# Patient Record
Sex: Female | Born: 1950 | Race: White | Hispanic: No | Marital: Married
Health system: Southern US, Community
[De-identification: ages and names within clinical notes are randomized; demographics above are authoritative.]

---

## 2009-05-02 ENCOUNTER — Inpatient Hospital Stay: Payer: Self-pay | Admitting: Student

## 2011-01-11 ENCOUNTER — Emergency Department: Payer: Self-pay | Admitting: Emergency Medicine

## 2011-01-14 ENCOUNTER — Emergency Department: Payer: Self-pay | Admitting: Emergency Medicine

## 2011-04-30 ENCOUNTER — Inpatient Hospital Stay: Payer: Self-pay | Admitting: Internal Medicine

## 2014-03-30 ENCOUNTER — Emergency Department: Payer: Self-pay | Admitting: Emergency Medicine

## 2014-03-30 LAB — URINALYSIS, COMPLETE
Bacteria: NONE SEEN
Bilirubin,UR: NEGATIVE
Blood: NEGATIVE
Glucose,UR: NEGATIVE mg/dL (ref 0–75)
Ketone: NEGATIVE
Nitrite: NEGATIVE
Ph: 7 (ref 4.5–8.0)
Protein: NEGATIVE
RBC,UR: 3 /HPF (ref 0–5)
Specific Gravity: 1.016 (ref 1.003–1.030)
Squamous Epithelial: 6
WBC UR: 31 /HPF (ref 0–5)

## 2014-03-30 LAB — BASIC METABOLIC PANEL WITH GFR
Anion Gap: 9 (ref 7–16)
BUN: 5 mg/dL — ABNORMAL LOW (ref 7–18)
Calcium, Total: 7.7 mg/dL — ABNORMAL LOW (ref 8.5–10.1)
Chloride: 104 mmol/L (ref 98–107)
Co2: 28 mmol/L (ref 21–32)
Creatinine: 0.59 mg/dL — ABNORMAL LOW (ref 0.60–1.30)
EGFR (African American): >60
EGFR (Non-African Amer.): >60
Glucose: 67 mg/dL (ref 65–99)
Osmolality: 277 (ref 275–301)
Potassium: 3.7 mmol/L (ref 3.5–5.1)
Sodium: 141 mmol/L (ref 136–145)

## 2014-03-30 LAB — TROPONIN I: Troponin-I: <0.02 ng/mL

## 2014-03-30 LAB — DRUG SCREEN, URINE
Amphetamines, Ur Screen: NEGATIVE (ref ?–1000)
Barbiturates, Ur Screen: NEGATIVE (ref ?–200)
Benzodiazepine, Ur Scrn: POSITIVE (ref ?–200)
CANNABINOID 50 NG, UR ~~LOC~~: NEGATIVE (ref ?–50)
Cocaine Metabolite,Ur ~~LOC~~: NEGATIVE (ref ?–300)
MDMA (Ecstasy)Ur Screen: NEGATIVE (ref ?–500)
METHADONE, UR SCREEN: NEGATIVE (ref ?–300)
Opiate, Ur Screen: POSITIVE (ref ?–300)
PHENCYCLIDINE (PCP) UR S: NEGATIVE (ref ?–25)
Tricyclic, Ur Screen: NEGATIVE (ref ?–1000)

## 2014-03-30 LAB — ETHANOL
Ethanol %: 0.083 % — ABNORMAL HIGH (ref 0.000–0.080)
Ethanol: 83 mg/dL

## 2014-03-30 LAB — CBC
HCT: 33.9 % — ABNORMAL LOW (ref 35.0–47.0)
HGB: 11.6 g/dL — ABNORMAL LOW (ref 12.0–16.0)
MCH: 37.4 pg — ABNORMAL HIGH (ref 26.0–34.0)
MCHC: 34.2 g/dL (ref 32.0–36.0)
MCV: 109 fL — ABNORMAL HIGH (ref 80–100)
PLATELETS: 438 10*3/uL (ref 150–440)
RBC: 3.1 10*6/uL — ABNORMAL LOW (ref 3.80–5.20)
RDW: 14.2 % (ref 11.5–14.5)
WBC: 6.4 10*3/uL (ref 3.6–11.0)

## 2014-05-20 ENCOUNTER — Ambulatory Visit: Payer: Self-pay | Admitting: Hospice and Palliative Medicine

## 2014-06-07 LAB — CBC WITH DIFFERENTIAL/PLATELET
BASOS ABS: 0 10*3/uL (ref 0.0–0.1)
Basophil %: 0.6 %
EOS PCT: 1.9 %
Eosinophil #: 0.1 10*3/uL (ref 0.0–0.7)
HCT: 30.1 % — ABNORMAL LOW (ref 35.0–47.0)
HGB: 10.3 g/dL — AB (ref 12.0–16.0)
LYMPHS ABS: 2.5 10*3/uL (ref 1.0–3.6)
LYMPHS PCT: 37.7 %
MCH: 36.3 pg — AB (ref 26.0–34.0)
MCHC: 34.1 g/dL (ref 32.0–36.0)
MCV: 106 fL — ABNORMAL HIGH (ref 80–100)
Monocyte #: 0.7 x10 3/mm (ref 0.2–0.9)
Monocyte %: 10.5 %
Neutrophil #: 3.2 10*3/uL (ref 1.4–6.5)
Neutrophil %: 49.3 %
Platelet: 343 10*3/uL (ref 150–440)
RBC: 2.83 10*6/uL — ABNORMAL LOW (ref 3.80–5.20)
RDW: 14.2 % (ref 11.5–14.5)
WBC: 6.5 10*3/uL (ref 3.6–11.0)

## 2014-06-07 LAB — URINALYSIS, COMPLETE
Bilirubin,UR: NEGATIVE
Blood: NEGATIVE
Glucose,UR: NEGATIVE mg/dL (ref 0–75)
Hyaline Cast: 3
Ketone: NEGATIVE
NITRITE: NEGATIVE
PH: 6 (ref 4.5–8.0)
Protein: NEGATIVE
Specific Gravity: 1.013 (ref 1.003–1.030)
Squamous Epithelial: 1
Transitional Epi: 1
WBC UR: 18 /HPF (ref 0–5)

## 2014-06-07 LAB — BASIC METABOLIC PANEL
Anion Gap: 8 (ref 7–16)
BUN: 6 mg/dL — AB (ref 7–18)
Calcium, Total: 8 mg/dL — ABNORMAL LOW (ref 8.5–10.1)
Chloride: 108 mmol/L — ABNORMAL HIGH (ref 98–107)
Co2: 26 mmol/L (ref 21–32)
Creatinine: 0.78 mg/dL (ref 0.60–1.30)
EGFR (Non-African Amer.): >60
Glucose: 78 mg/dL (ref 65–99)
Osmolality: 280 (ref 275–301)
Potassium: 4.5 mmol/L (ref 3.5–5.1)
SODIUM: 142 mmol/L (ref 136–145)

## 2014-06-09 ENCOUNTER — Inpatient Hospital Stay: Payer: Self-pay | Admitting: Internal Medicine

## 2014-06-09 LAB — TSH: Thyroid Stimulating Horm: 4.2 u[IU]/mL

## 2014-06-19 ENCOUNTER — Ambulatory Visit: Payer: Self-pay | Admitting: Hospice and Palliative Medicine

## 2015-01-10 NOTE — Discharge Summary (Signed)
Dates of Admission and Diagnosis:  Date of Admission 09-Jun-2014   Date of Discharge 12-Jun-2014   Admitting Diagnosis Weakness, weight loss   Final Diagnosis lung nodule- weight loss, loss of appetite- may be malignancy COPD UTI    Chief Complaint/History of Present Illness a 64 year old Caucasian female who presents to the Emergency Department with severe pain on her left side. The patient states that she fell 3 days ago. She landed on her left hip and somehow struck the medial side of her left knee as well. She was able to scoot on the floor over to her couch, but was unable to lift herself due to weakness. She called multiple friends and subsequently the EMS staff, who helped her into her chair. She has been sleeping on the couch since that time and has been so weak she has been unable to reach the restroom. This caused her to defecate in a trash can in her living room. She states that she is mostly in pain in her left hip and groin, but feels as if she has ???knives in her knee". She thinks that she fell because she got dizzy, although she admits that her upper body strength is weak. She ambulates with a walker.   Also, the patient states that she was diagnosed with a urinary tract infection at her last doctor's appointment earlier this week. She had been taking oral antibiotics, but was instructed by her primary care physician  that they would send another antibiotic based on sensitivities of her urine culture. For these reasons, the Emergency Department staff called for admission.   Allergies:  Penicillin: Other  General Ref:  19-Sep-15 05:18   Vitamin D 1,25 - Dihydroxy - 10 ========== TEST NAME ==========  ========= RESULTS =========  = REFERENCE RANGE =  CALCITRIOL  Calcitriol(1,25 di-OH Vit D) Calcitriol(1,25 di-OH Vit D)    [   31.3 pg/mL           ]         19.9-79.3               [facility]  No: [id number]           [address], [place], [place] [address]            Lindon Romp, MD         [phone]   Result(s) reported on 09 Jun 2014 at 05:30PM.  Routine Chem:  19-Sep-15 05:18   Glucose, Serum 78  BUN  6  Creatinine (comp) 0.78  Sodium, Serum 142  Potassium, Serum 4.5  Chloride, Serum  108  CO2, Serum 26  Calcium (Total), Serum  8.0  Anion Gap 8  Osmolality (calc) 280  eGFR (African American) >60  eGFR (Non-African American) >60 (eGFR values <6m/min/1.73 m2 may be an indication of chronic kidney disease (CKD). Calculated eGFR is useful in patients with stable renal function. The eGFR calculation will not be reliable in acutely ill patients when serum creatinine is changing rapidly. It is not useful in  patients on dialysis. The eGFR calculation may not be applicable to patients at the low and high extremes of body sizes, pregnant women, and vegetarians.)  Routine UA:  19-Sep-15 12:09   Color (UA) Yellow  Clarity (UA) Clear  Glucose (UA) Negative  Bilirubin (UA) Negative  Ketones (UA) Negative  Specific Gravity (UA) 1.013  Blood (UA) Negative  pH (UA) 6.0  Protein (UA) Negative  Nitrite (UA) Negative  Leukocyte Esterase (UA) 2+ (Result(s) reported  on 07 Jun 2014 at 03:32PM.)  RBC (UA) 2 /HPF  WBC (UA) 18 /HPF  Bacteria (UA) TRACE  Epithelial Cells (UA) 1 /HPF  Transitional Epithelial (UA) 1 /HPF  Hyaline Cast (UA) 3 /LPF (Result(s) reported on 07 Jun 2014 at 03:32PM.)  Routine Hem:  19-Sep-15 05:18   WBC (CBC) 6.5  RBC (CBC)  2.83  Hemoglobin (CBC)  10.3  Hematocrit (CBC)  30.1  Platelet Count (CBC) 343  MCV  106  MCH  36.3  MCHC 34.1  RDW 14.2  Neutrophil % 49.3  Lymphocyte % 37.7  Monocyte % 10.5  Eosinophil % 1.9  Basophil % 0.6  Neutrophil # 3.2  Lymphocyte # 2.5  Monocyte # 0.7  Eosinophil # 0.1  Basophil # 0.0 (Result(s) reported on 07 Jun 2014 at 05:36AM.)   PERTINENT RADIOLOGY STUDIES: [place]:    21-Sep-15 11:28, US Kidney Bilateral  US Kidney Bilateral   REASON FOR EXAM:    mass in  upper pole of kidney on CT chest.  COMMENTS:       PROCEDURE: [place]  - US KIDNEY  - Jun 09 2014 11:28AM     CLINICAL DATA:  Possible right renal mass.    EXAM:  RENAL/URINARY TRACT ULTRASOUND COMPLETE    COMPARISON:  CT scan of chest of June 07, 2014; renal  ultrasound of May 01, 2011.    FINDINGS:  Right Kidney:  Length: 6.1 cm. Bilobed cyst measuring 1.9 x 1.2 x 1.1 cm is seen in  upper pole. This is not significantly changed compared to prior  exam. Renal parenchyma is echogenic consistent with medical renal  disease.    Left Kidney:    Length: 11.1 cm. Simple cyst measuring 1.2 x 1.1 x 0.7 cm is noted  medially. Echogenicity within normal limits. No mass or  hydronephrosis visualized.    Bladder:    Appears normal for degree of bladder distention. Bilateral ureteral  jets are noted.   IMPRESSION:  Bilateral simple renal cysts are noted. No mass is noted. Severe  right renal atrophy is noted.      Electronically Signed    By: Sabino Dick M.D.    On: 06/09/2014 12:09         Verified By: Marveen Reeks, M.D.,  [place]:    12-Jul-15 18:33, CT ANGIOGRAPHY Chest with for PE  PACS Image     19-Sep-15 11:02, CT Chest With Contrast  PACS Image     21-Sep-15 11:28, US Kidney Bilateral  PACS Image   CT:    19-Sep-15 11:02, CT Chest With Contrast  CT Chest With Contrast   REASON FOR EXAM:    evaluation of lung nodule- progression- weight loss   and generalized weakness.  COMMENTS:       PROCEDURE: CT  - CT CHEST WITH CONTRAST  - Jun 07 2014 11:02AM     CLINICAL DATA:  Increased weight loss and weakness. History of lung  nodule. COPD.    EXAM:  CT CHEST WITH CONTRAST    TECHNIQUE:  Multidetector CT imaging of the chest was performed during  intravenous contrast administration.  CONTRAST:  75 mL Isovue-300    COMPARISON:  03/30/2014 and 04/30/2011 chest CT    FINDINGS:  No enlarged axillary, mediastinal, or hilar lymph nodes are  identified.  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 Three-vessel coronary artery  calcification is present. The  heart is not enlarged. There is no  pleural or pericardial effusion. 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    Advanced centrilobular emphysema is present. 9 mm nodule in the  posteriorsegment of the right upper lobe is unchanged from recent  chest CT (series 3, image 25). 6 mm nodule along the right major  fissure is unchanged from 2012 (series 3, image 28). 2 mm left upper  lobe subpleural nodule is unchanged (series 3, image 23). Mild  scarring is present in the right middle lobe. Dependent subsegmental  atelectasis is present in the left lower lobe. No new nodules are  identified.    A small amount of fluid is questioned inferior to the spleen on the  lowest axial image (image 66). Small right upper pole hypodense  renal lesion is again partially visualized. No lytic or blastic  osseous lesions are identified.     IMPRESSION:  1. 9 mm right upper lobe nodule, unchanged from recent chest CT but  new from 20/12 CT and suspicious for small neoplasm.  2. Emphysema.  3. Question small amount of free fluid in the left upper quadrant of  the abdomen, incompletely visualized.      Electronically Signed    By: Logan Bores    On: 06/07/2014 11:45         Verified By: Ferol Luz, M.D.,   Pertinent Past History:  Pertinent Past History Two strokes, peripheral neuropathy, gastroesophageal reflux disease, as well as lung masses recently seen on chest x-ray.   Hospital Course:  Hospital Course * Generalized weakness and weight loss-    Likely due to Malignancy/ severe emphysema and smoker, Weight and appetite loss. awaited stool guiac.   CT chest- to compare from July 2015. no significant changes in the nodules- but severe emphysema.   Multi  vitamins, PT eval,   May be UTi is also playing a role here.    as per PT need SNF, as very unsafe ambulation due to extreme weakness.   also have severe malnutrition- need supervision and help with diet also.   Called palliative care team to help with discussing goals of treatment.  Pt agreed on hospice- awaiting further arrangement.  * UTI   Was on Oral Abx as out pt- on Cefepime now.   UA- positive. will give total for 7 days.  * Renal hyperdense lesion on some lower cuts on CT chest.  simple cysts on Renal sonogram.    * Generalized pain-    Likely due to fall and occult malignancy   IV morphin as needed- started Long acting oxycontin.  *Severe emphysema   No significant wheezing- cont nebulizer treatment.   May need to set up home Oxygen.  * Smoker- councelled  to quit for 4 min   Nicotin patch. hospice/  SNF placement, may need Oxygen on d/c due to severe emphysema. Discussed with CM and family today about placement.   Condition on Discharge Stable   Code Status:  Code Status No Code/Do Not Resuscitate   DISCHARGE INSTRUCTIONS HOME MEDS:  Medication Reconciliation: Patient's Home Medications at Discharge:     Medication Instructions  symbicort 160 mcg-4.5 mcg/inh inhalation aerosol  2 puff(s) inhaled 2 times a day   thiamine 100 mg oral tablet  1 tab(s) orally once a day   tamsulosin 0.4 mg oral capsule  1 cap(s) orally once a day (after a meal)   venlafaxine 150 mg oral capsule, extended release  1  cap(s) orally once a day   quetiapine 25 mg oral tablet  1 tab(s) orally once a day (at bedtime)   albuterol-ipratropium 2.5 mg-0.5 mg/3 ml inhalation solution   inhaled    budesonide-formoterol 160 mcg-4.5 mcg/inh inhalation aerosol   inhaled    pantoprazole 40 mg oral delayed release tablet  1 tab(s) orally once a day   morphine 15 mg/12 hr oral tablet, extended release  1 tab(s) orally every 12 hours   gabapentin 300 mg oral capsule  1 cap(s) orally 3 times a day    ondansetron 4 mg oral tablet  1 tab(s) orally every 4 hours, As needed, nausea, vomiting   docusate sodium 100 mg oral capsule  1 cap(s) orally 2 times a day, As needed, Stool Softener   nicotine 14 mg/24 hr transdermal film, extended release  1 patch transdermal once a day   folic acid 1 mg oral tablet  1 tab(s) orally once a day   cholecalciferol oral tablet  2000 unit(s) orally once a day    STOP TAKING THE FOLLOWING MEDICATION(S):    gabapentin 300 mg , one three times a day. vit d 2000 u one a day folate 32m po daily  Physician's Instructions:  Diet Regular   Activity Limitations As tolerated   Return to Work Not Applicable   Time frame for Follow Up Appointment 4-6 weeks  as needed.   Electronic Signatures: VVaughan Basta(MD)  (Signed 23-Sep-15 16:55)  Authored: ADMISSION DATE AND DIAGNOSIS, CHIEF COMPLAINT/HPI, Allergies, PERTINENT LABS, PERTINENT RADIOLOGY STUDIES, PERTINENT PAST HISTORY, HOSPITAL COURSE, DISCHARGE INSTRUCTIONS HOME MEDS, PATIENT INSTRUCTIONS   Last Updated: 23-Sep-15 16:55 by VVaughan Basta(MD)

## 2015-01-10 NOTE — H&P (Signed)
PATIENT NAME:  Karen Juarez, Karen Juarez MR#:  [id number] DATE OF BIRTH:  [birth year]  DATE OF ADMISSION:  06/07/2014  REFERRING PHYSICIAN: Gladstone Pih, MD    PRIMARY CARE PHYSICIAN: Johnette Abraham, MD - [place], [place]  ADMIT DIAGNOSIS: Severe pain and difficulty walking.   HISTORY OF PRESENT ILLNESS: This is a 64 year old Caucasian female who presents to the Emergency Department with severe pain on her left side. The patient states that she fell 3 days ago. She landed on her left hip and somehow struck the medial side of her left knee as well. She was able to scoot on the floor over to her couch, but was unable to lift herself due to weakness. She called multiple friends and subsequently the EMS staff, who helped her into her chair. She has been sleeping on the couch since that time and has been so weak she has been unable to reach the restroom. This caused her to defecate in a trash can in her living room. She states that she is mostly in pain in her left hip and groin, but feels as if she has "knives in her knee". She thinks that she fell because she got dizzy, although she admits that her upper body strength is weak. She ambulates with a walker.   Also, the patient states that she was diagnosed with a urinary tract infection at her last doctor's appointment earlier this week. She had been taking oral antibiotics, but was instructed by her primary care physician  that they would send another antibiotic based on sensitivities of her urine culture. For these reasons, the Emergency Department staff called for admission.   REVIEW OF SYSTEMS:  CONSTITUTIONAL: The patient denies fever, but admits to a 50 pound weight loss over the last year.  EYES: The patient denies diplopia or inflammation.  ENT: The patient denies tinnitus or difficulty swallowing.  RESPIRATORY: The patient denies cough, but admits to shortness of breath.  CARDIOVASCULAR: The patient denies chest pain, but admits to dyspnea on exertion.    GASTROINTESTINAL: The patient admits to nausea but denies vomiting, diarrhea or abdominal pain.  GENITOURINARY: The patient admits to mild dysuria as well as urinary hesitancy.  ENDOCRINE: The patient denies polyuria or nocturia.  HEMATOLOGIC AND LYMPHATIC: The patient denies easy bruising or bleeding.  INTEGUMENTARY: The patient denies rashes or lesions.  MUSCULOSKELETAL: The patient admits to joint pain as mentioned above, as well as muscle weakness.  NEUROLOGIC: The patient denies numbness in her feet, but admits to tingling. She also denies dysarthria.  PSYCHIATRIC: The patient admits to depression, but denies suicidal ideation.   PAST MEDICAL HISTORY: Two strokes, peripheral neuropathy, gastroesophageal reflux disease, as well as lung masses recently seen on chest x-ray.   PAST SURGICAL HISTORY: The patient has had multiple surgeries for endometriosis. She has had tonsillectomy and adenoidectomy, as well as ingrown toe nail removal on her great toes bilaterally.   SOCIAL HISTORY: The patient is a 40 pack-year smoker. She lives alone. She does not do any drugs, but she does drink approximately 2 alcoholic drinks per day. She does not drive.   FAMILY HISTORY: Significant for coronary artery disease in her father and diabetes and her mother.   MEDICATIONS:  1.  Albuterol with ipratropium 2 puffs inhaled 4 times a day.  2.  Flomax 0.4 mg 1 capsule p.o. daily.  3.  Folate 1 mg p.o. daily.  4.  Gabapentin 300 mg 1 tablet p.o. t.i.d.  5.  Metoprolol 50 mg one-half  tab twice daily.  6.  Prednisone unknown quantity.  7.  Protonix 40 mg 1 tablet p.o. b.i.d.  8.  Cervical 25 mg 1 tablet p.o. daily.  9.  Simvastatin 80 mg 1 tablet p.o. at bedtime.  10.  Symbicort 160 mcg/4.5 mcg 2 puffs inhaled b.i.d.  11.  Thiamine 100 mg 1 tablet p.o. daily.  12.  Tramadol 50 mg 1 tablet p.o. every 8 hours as needed.  13.  Venlafaxine 150 mg 1 capsule p.o. daily.  14.  Vitamin D 2000 international units 1  tablet p.o. daily.   ALLERGIES: PENICILLIN.   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 There is no acute fracture. Left knee x-ray shows no evidence of fracture or dislocation. There are some sclerotic changes, as well as diffuse vascular calcifications. X-ray of the tibia and fibula of the left leg show no evidence of fracture, but there is some osteopenia.   PHYSICAL EXAMINATION:  VITAL SIGNS: Temperature is 98.6, pulse 94, respirations 22, blood pressure 147/74, pulse oximetry 95% on room air. At bedside, the patient is subsequently placed on supplemental oxygen via nasal cannula.  GENERAL: The patient is alert and oriented x3. She is clearly in pain.  HEENT: Normocephalic, atraumatic. Pupils equal, round and reactive to light and accommodation. Extraocular movements are intact. Mucous membranes are moist.  NECK: Trachea is midline. There is no adenopathy.  CHEST: Symmetric and atraumatic.  CARDIOVASCULAR: Regular rate and rhythm. Normal S1, S2. No rubs, clicks, or murmurs appreciated.  LUNGS: Expiratory wheezes bilaterally in all lung fields with mildly increased respiratory effort as evidenced by some subglottic retractions.  ABDOMEN: Positive bowel sounds. Soft, nontender, nondistended. No hepatosplenomegaly appreciated.  GENITOURINARY: Deferred.  MUSCULOSKELETAL: The patient moves all 4 extremities equally and has 5/5 strength, but she has muscle wasting, as well as very little subcutaneous fat. She has tenderness of her left hip and left knee as well as her right knee.  SKIN: No rashes or lesions.  EXTREMITIES: No clubbing or cyanosis. The patient does have some nonpitting edema of her ankles bilaterally.  NEUROLOGIC: Cranial nerves II through XII are grossly intact.  PSYCHIATRIC: Mood is pleasant, but depressed. Affect  is congruent.   ASSESSMENT AND PLAN: This is a 64 year old lady with severe musculoskeletal pain, as well as muscle wasting, unfortunately likely due to a thoracic malignancy. She also has a urinary tract infection.  1.  Pain. I have given the patient some IV narcotics to alleviate her pain, particularly in light of the fact that she may have a malignancy. No doubt, her pain is multifactorial, in that she has severe arthritis, some osteopenia and she has had this recent fall. The skeletal films that we have obtained do not show any lytic lesions at this time. We will order a complete metabolic panel to assess her alkaline phosphatase. I also ordered a vitamin D level. For her difficulty ambulating, I have placed physical therapy and occupational therapy consults, as well as a discharge planning consult for possible personal care services or home health.  2.  Urinary tract infection. I will give the patient a dose of cefepime at this time. We may try to get in touch with her primary care doctor for sensitivities of that urine culture, which prompted a change from oral Bactrim. At this time the patient does not consistently show signs or  symptoms of a sepsis.  3.  Lung masses. Given the patient's muscle wasting and history of weight loss, as well as tobacco use, these reported masses are likely malignant. The patient is part of the [employer], but has had some significant delays in diagnosis and/or treatment. During this hospitalization, I will certainly order a CT of her chest, and I have already discussed with the patient her wishes in the event that she is found to have cancer. I have also ordered a palliative care consult to help the patient navigate the course of her care from here out. Her quality of life is poor at this time, and one of her desires is to eat. I have ordered the patient some Marinol to help with her appetite as well as nausea.  4.  Deep vein thrombosis prophylaxis.  Lovenox.  5.  Gastrointestinal prophylaxis. Pantoprazole.  6.  The patient wishes to be a Do Not Resuscitate.    ____________________________ Kelton Pillar. Sheryle Hail, MD msd:MT D: 06/07/2014 05:02:20 ET T: 06/07/2014 05:28:38 ET JOB#: [id number]  cc: Kelton Pillar. Sheryle Hail, MD, <Dictator> Kelton Pillar Alysah Carton MD ELECTRONICALLY SIGNED 06/07/2014 21:07

## 2015-01-10 NOTE — Discharge Summary (Signed)
PATIENT NAME:  Karen Juarez, Karen Juarez MR#:  [id number] DATE OF BIRTH:  July 23, 1951  DATE OF ADMISSION:  06/09/2014 DATE OF DISCHARGE:  06/12/2014  ADDENDUM: The patient could not go on the 23rd of September and I arranged for hospice discharge on the 24th of September, so the patient is being discharged. For further details, please see the discharge summary done on the 23rd of September.    ____________________________ Karen PigeonVaibhavkumar G. [name], MD vgv:TT D: 06/13/2014 13:24:27 ET T: 06/13/2014 15:11:25 ET JOB#: [id number]  cc: Karen PigeonVaibhavkumar G. [name], MD, <Dictator> Altamese DillingVAIBHAVKUMAR Lenice Koper MD ELECTRONICALLY SIGNED 06/21/2014 12:49

## 2015-04-20 DEATH — deceased

## 2016-01-18 IMAGING — CT CT CHEST W/ CM
2 of 3 series · 15 of 36 positions shown, 18 images · IV contrast (agent unspecified)
Comparison: 03/30/2014 and 04/30/2011 chest CT

CLINICAL DATA: Increased weight loss and weakness. History of lung
nodule. COPD.

EXAM:
CT CHEST WITH CONTRAST
TECHNIQUE: Multidetector CT imaging of the chest was performed during
intravenous contrast administration.
CONTRAST:  75 mL Qsovue-NDD

[Series 2: routine chest with · axial · 0.56mm/px · z∈[-748,-468]mm · 12 of 66 slices shown, 15 images]
[im 5/66  mediastinal]
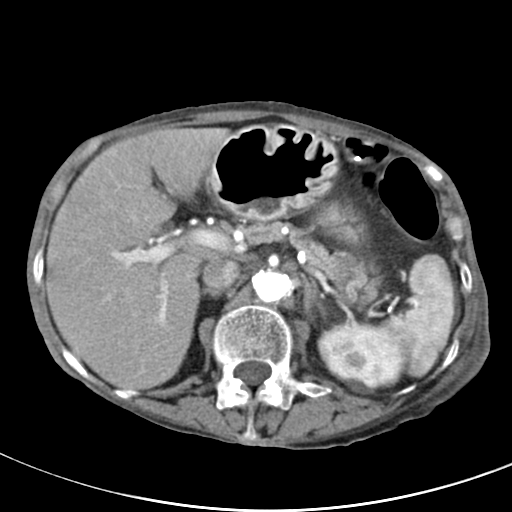
[im 5/66  lung]
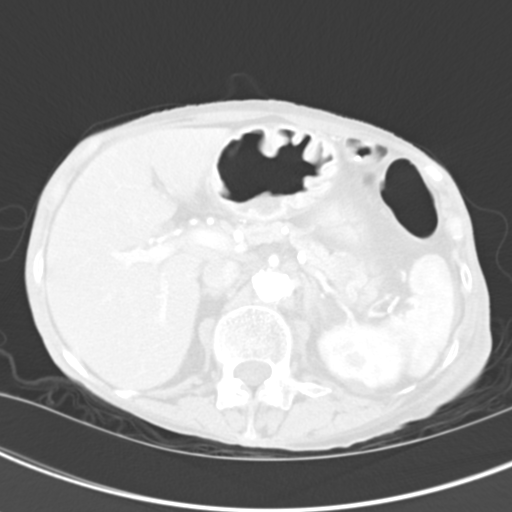
[im 10/66  lung]
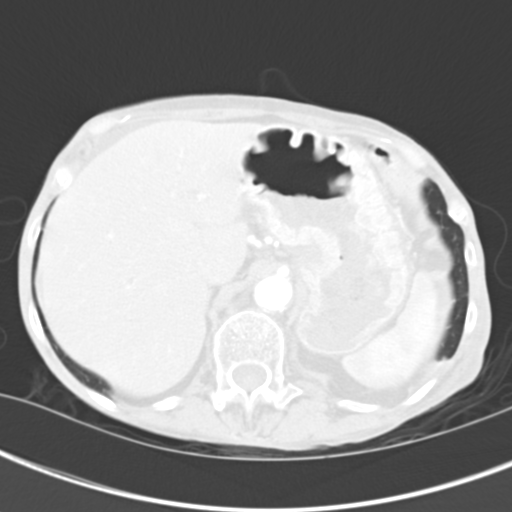
[im 15/66  lung]
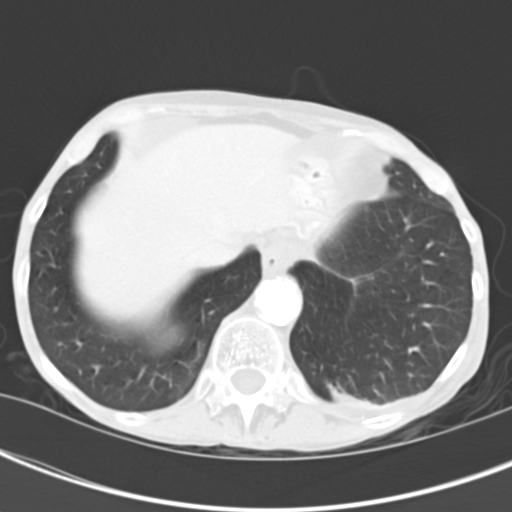
[im 20/66  lung]
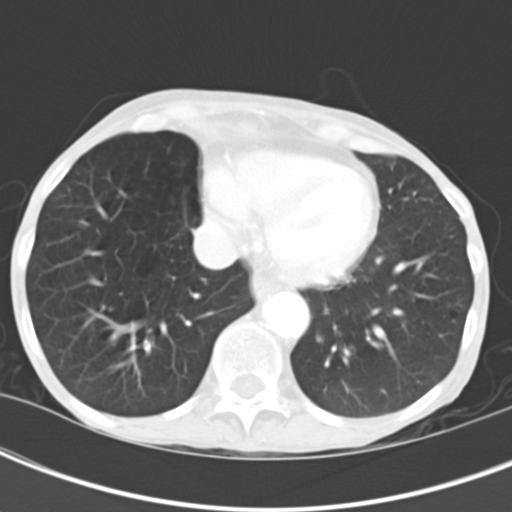
[im 25/66  mediastinal]
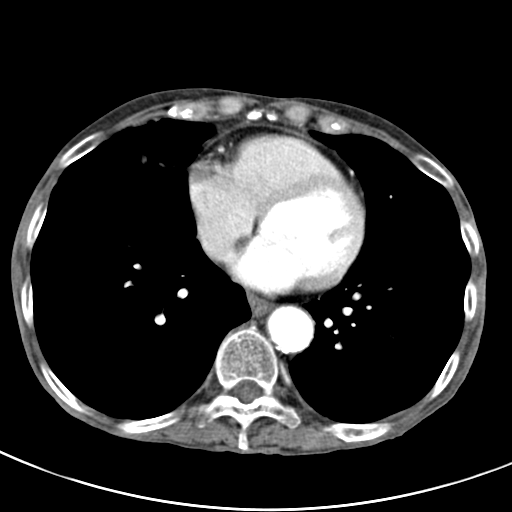
[im 25/66  lung]
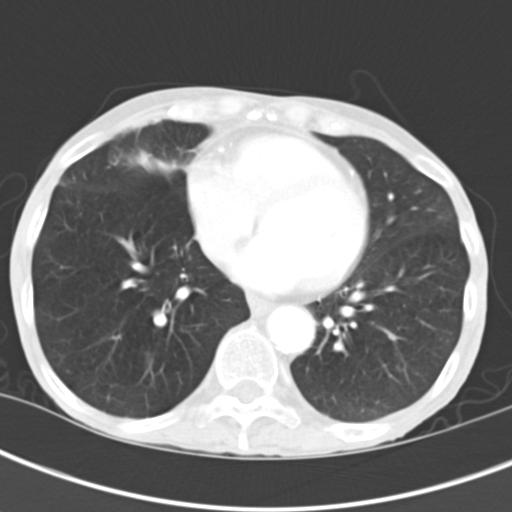
[im 29/66  lung]
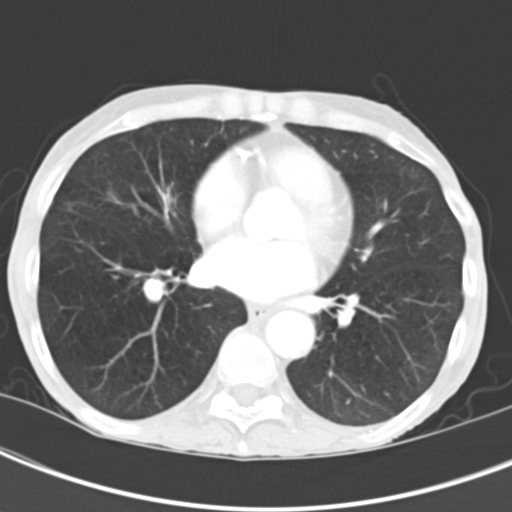
[im 37/66  lung]
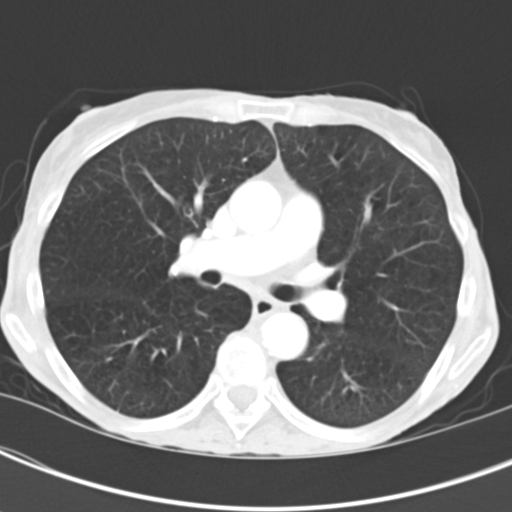
[im 41/66  lung]
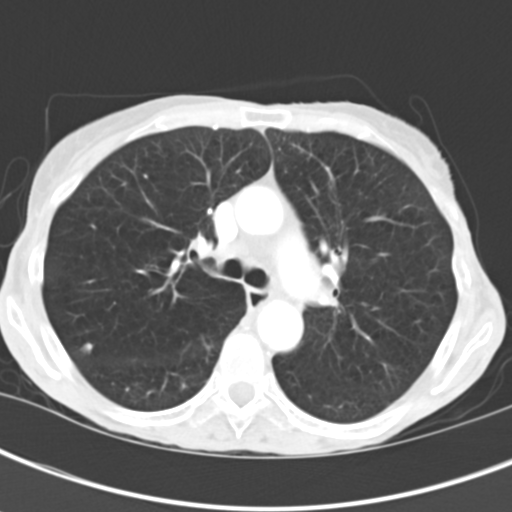
[im 46/66  mediastinal]
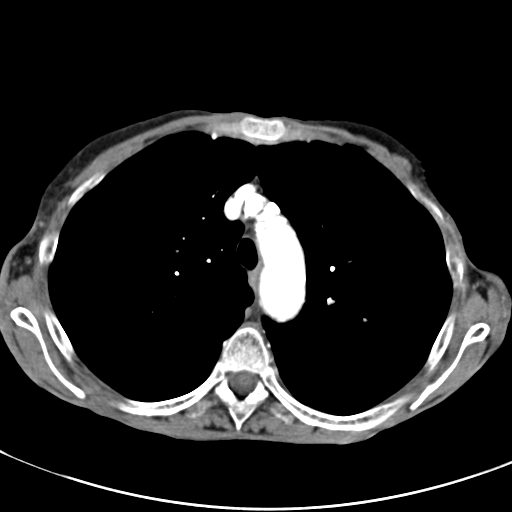
[im 46/66  lung]
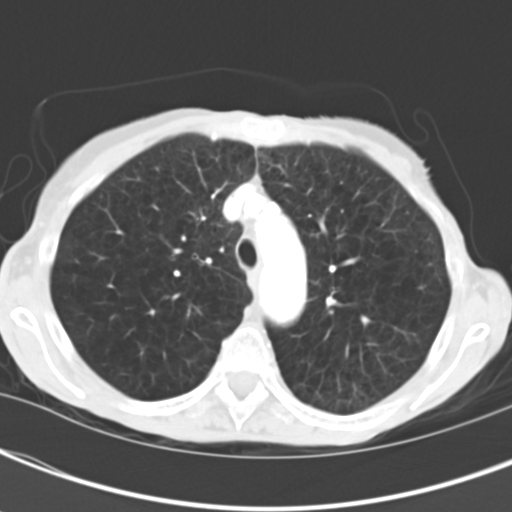
[im 51/66  lung]
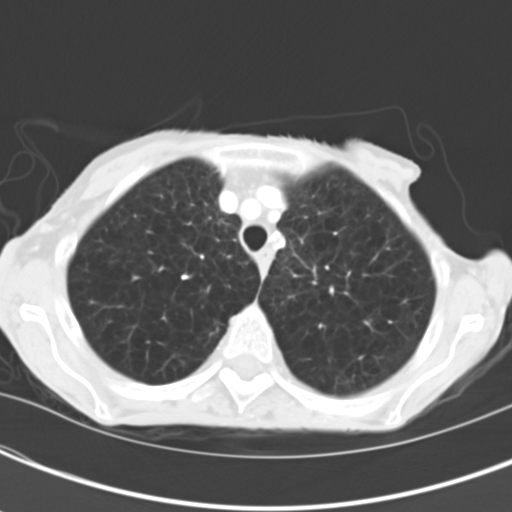
[im 56/66  lung]
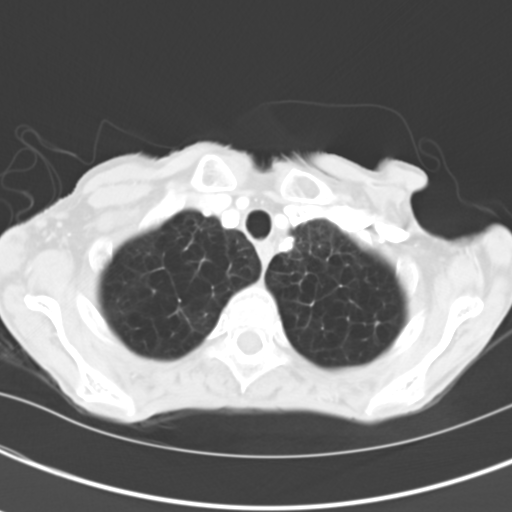
[im 61/66  lung]
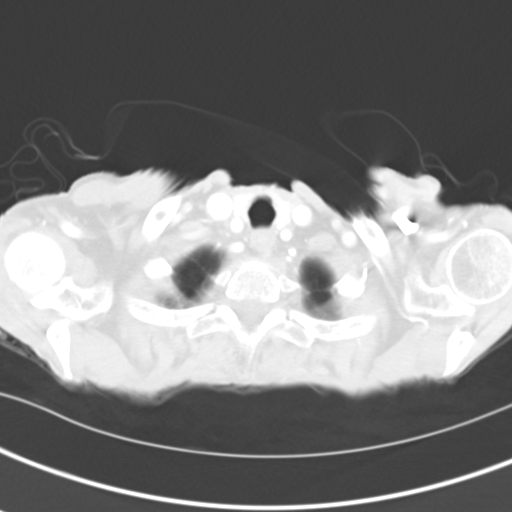

[Series 5: cor routine chest with · coronal · 0.55mm/px · 3 of 100 slices shown]
[im 20/100  lung]
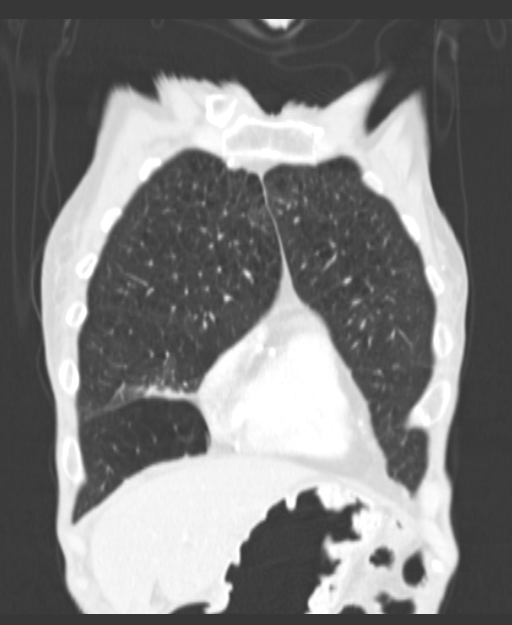
[im 40/100  lung]
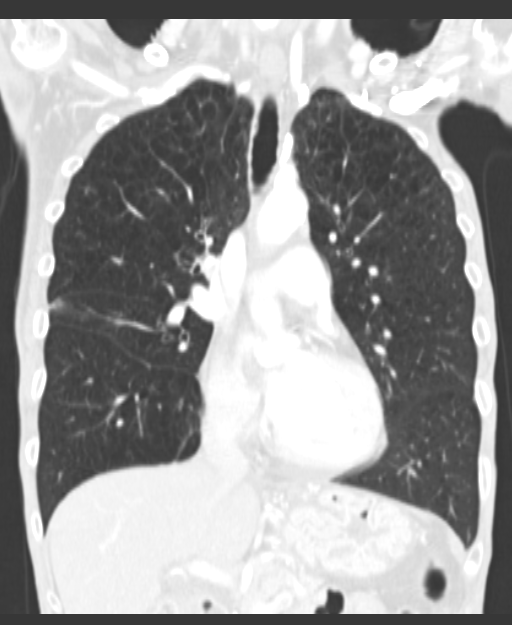
[im 60/100  lung]
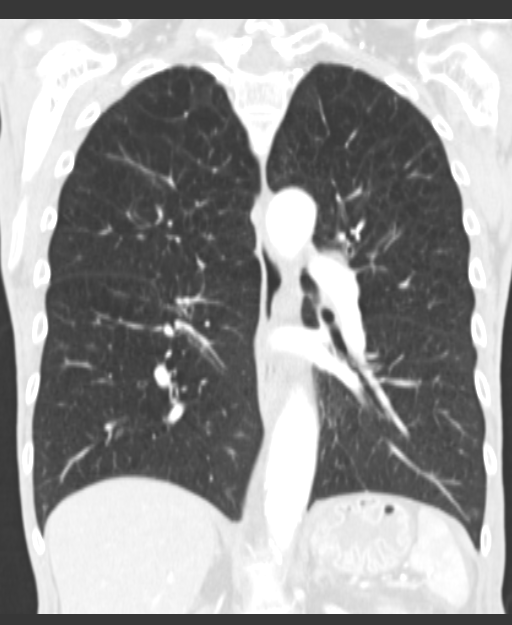

[15 of 36 positions shown; findings below may reference images not displayed]

FINDINGS: No enlarged axillary, mediastinal, or hilar lymph nodes are
identified. Aortic arch atherosclerotic calcification is present,
and there is severe stenosis if not complete occlusion of the
proximal left subclavian artery. Three-vessel coronary artery
calcification is present. The heart is not enlarged. There is no
pleural or pericardial effusion. Small fat containing left-sided
Bochdalek's hernia is again seen.

Advanced centrilobular emphysema is present. 9 mm nodule in the
posterior segment of the right upper lobe is unchanged from recent
chest CT (series 3, image 25). 6 mm nodule along the right major
fissure is unchanged from 8558 (series 3, image 28). 2 mm left upper
lobe subpleural nodule is unchanged (series 3, image 23). Mild
scarring is present in the right middle lobe. Dependent subsegmental
atelectasis is present in the left lower lobe. No new nodules are
identified.

A small amount of fluid is questioned inferior to the spleen on the
lowest axial image (image 66). Small right upper pole hypodense
renal lesion is again partially visualized. No lytic or blastic
osseous lesions are identified.
IMPRESSION: 1. 9 mm right upper lobe nodule, unchanged from recent chest CT but
new from [DATE] CT and suspicious for small neoplasm.
2. Emphysema.
3. Question small amount of free fluid in the left upper quadrant of
the abdomen, incompletely visualized.
